# Patient Record
Sex: Male | Born: 2000 | Hispanic: Yes | Marital: Single | Smoking: Never smoker
Health system: Southern US, Community
[De-identification: ages and names within clinical notes are randomized; demographics above are authoritative.]

## PROBLEM LIST (undated history)

## (undated) DIAGNOSIS — H9190 Unspecified hearing loss, unspecified ear: Secondary | ICD-10-CM

## (undated) DIAGNOSIS — IMO0001 Reserved for inherently not codable concepts without codable children: Secondary | ICD-10-CM

## (undated) HISTORY — DX: Reserved for inherently not codable concepts without codable children: IMO0001

## (undated) HISTORY — DX: Unspecified hearing loss, unspecified ear: H91.90

---
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
## 2019-03-26 IMAGING — CR DG ANKLE COMPLETE 3+V*L*
3 series · 3 of 3 positions shown · non-contrast
Comparison: None.

CLINICAL DATA: Left ankle pain after soccer injury.

EXAM:
LEFT ANKLE COMPLETE - 3+ VIEW

[ankle ap]
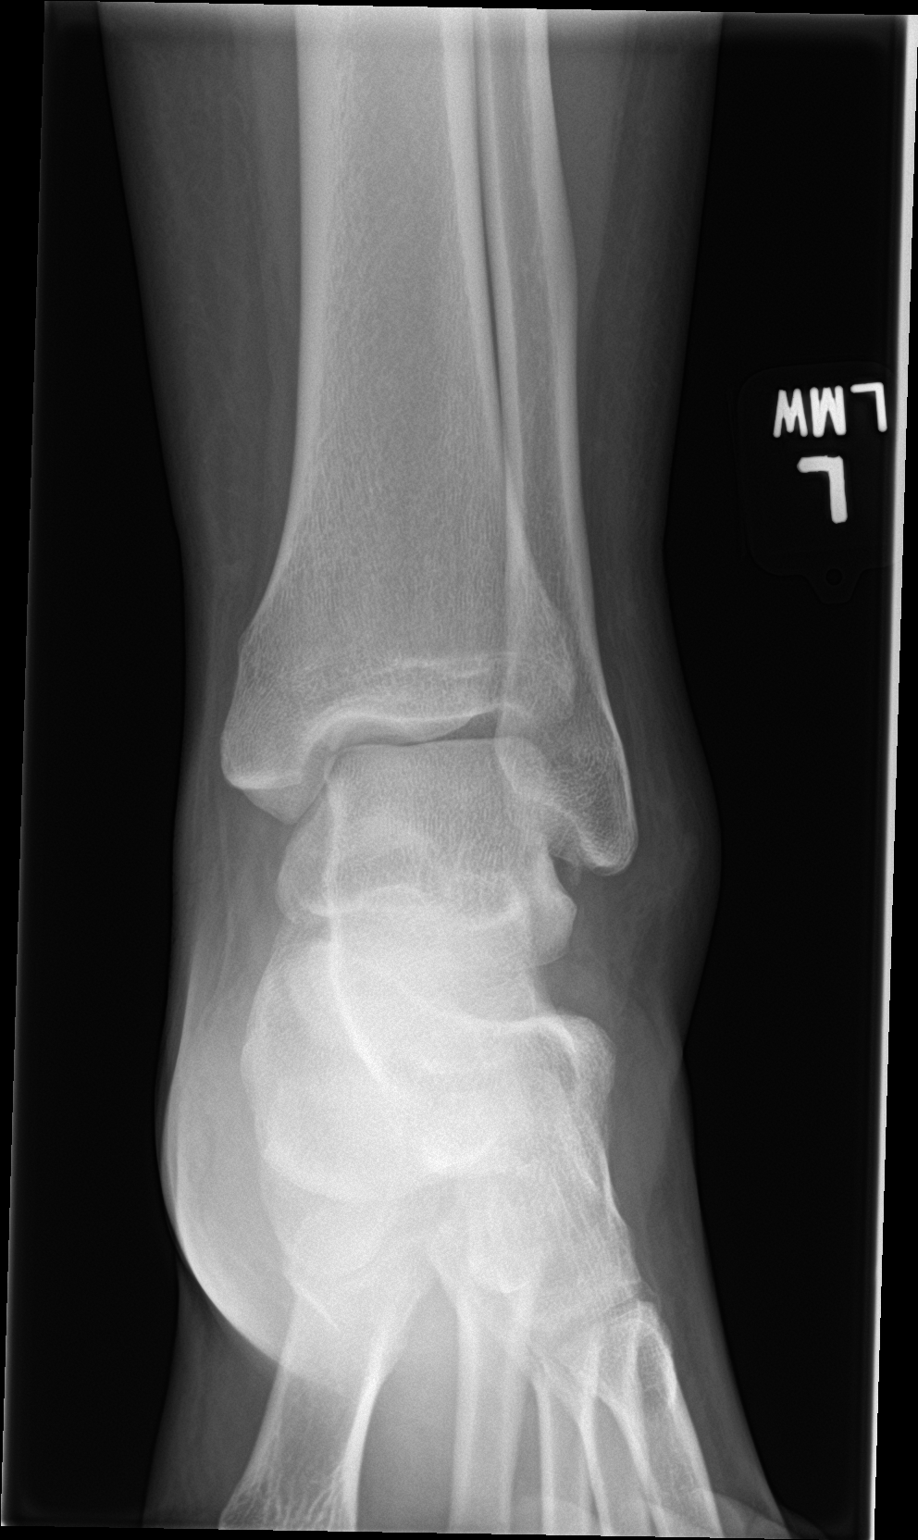

[ankle obl]
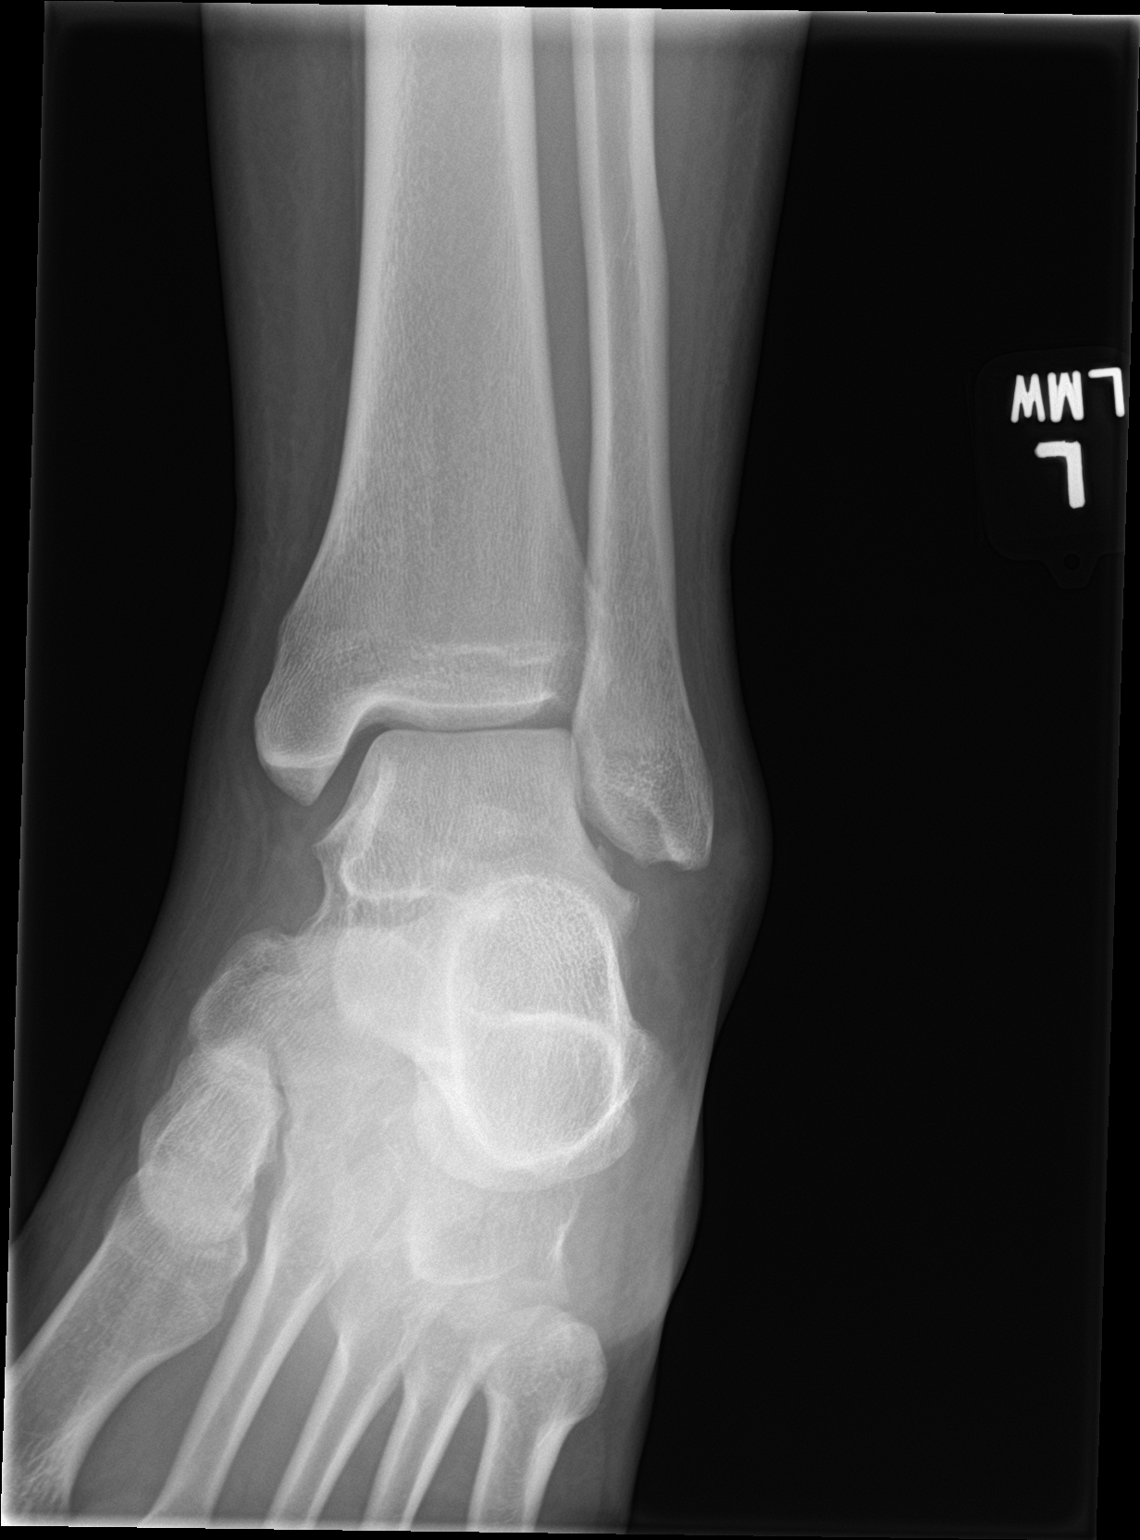

[ankle lat]
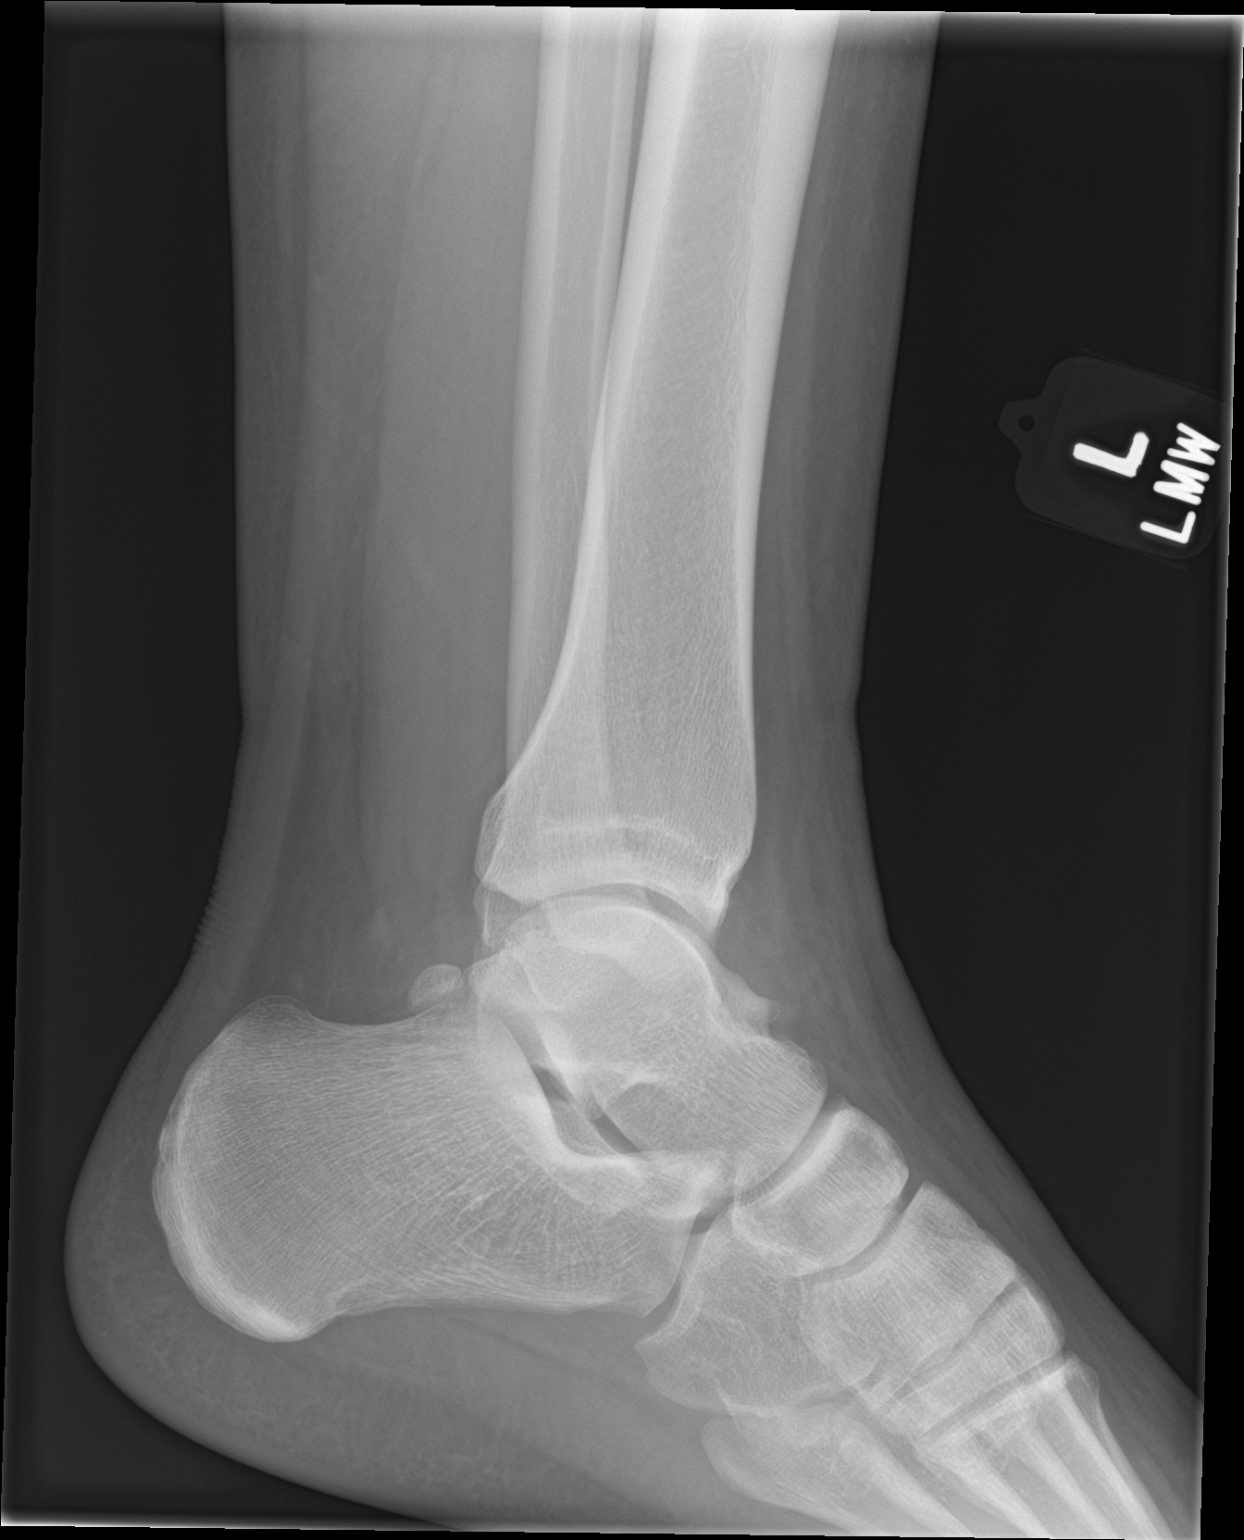

[3 of 3 positions shown; findings below may reference images not displayed]

FINDINGS: There is a small ossific density at the tip of the lateral
malleolus, suspicious for avulsion fracture. Prominent soft tissue
swelling overlying the lateral malleolus. The talar dome is intact.
The ankle mortise is symmetric. Moderate tibiotalar joint effusion.
Bone mineralization is normal.
IMPRESSION: 1. Small avulsion fracture of the lateral malleolus with overlying
soft tissue swelling.

## 2019-05-24 DIAGNOSIS — Z974 Presence of external hearing-aid: Secondary | ICD-10-CM | POA: Diagnosis not present

## 2019-05-24 DIAGNOSIS — H903 Sensorineural hearing loss, bilateral: Secondary | ICD-10-CM | POA: Diagnosis not present

## 2019-06-07 DIAGNOSIS — H905 Unspecified sensorineural hearing loss: Secondary | ICD-10-CM | POA: Diagnosis not present

## 2019-06-10 ENCOUNTER — Other Ambulatory Visit: Payer: Self-pay | Admitting: Family Medicine

## 2019-06-10 ENCOUNTER — Ambulatory Visit: Payer: Medicaid Other | Admitting: Physician Assistant

## 2019-06-10 ENCOUNTER — Other Ambulatory Visit: Payer: Self-pay

## 2019-06-10 DIAGNOSIS — Z113 Encounter for screening for infections with a predominantly sexual mode of transmission: Secondary | ICD-10-CM

## 2019-06-10 DIAGNOSIS — Z0389 Encounter for observation for other suspected diseases and conditions ruled out: Secondary | ICD-10-CM | POA: Diagnosis not present

## 2019-06-10 DIAGNOSIS — Z1388 Encounter for screening for disorder due to exposure to contaminants: Secondary | ICD-10-CM | POA: Diagnosis not present

## 2019-06-10 DIAGNOSIS — Z3009 Encounter for other general counseling and advice on contraception: Secondary | ICD-10-CM | POA: Diagnosis not present

## 2019-06-11 ENCOUNTER — Encounter: Payer: Self-pay | Admitting: Physician Assistant

## 2019-06-11 LAB — GRAM STAIN

## 2019-06-11 NOTE — Progress Notes (Signed)
    STI clinic/screening visit  Subjective:  Jack Christian is a 18 y.o. male being seen today for an STI screening visit. The patient reports they do not have symptoms.   Patient has the following medical conditions:  There are no active problems to display for this patient.    Chief Complaint  Patient presents with  . SEXUALLY TRANSMITTED DISEASE    HPI  Patient reports that he is not having any symptoms but would like a screening today.  Denies chronic conditions and history of surgery but states that he has a hearing impairment and will be evaluated for cochlear implant surgery in the next month.    See flowsheet for further details and programmatic requirements.    The following portions of the patient's history were reviewed and updated as appropriate: allergies, current medications, past medical history, past social history, past surgical history and problem list.  Objective:  There were no vitals filed for this visit.  Physical Exam Constitutional:      General: He is not in acute distress.    Appearance: Normal appearance. He is obese.  HENT:     Head: Normocephalic and atraumatic.     Comments: No nits, lice, or hair loss. No adenopathy in the head and neck area.    Mouth/Throat:     Mouth: Mucous membranes are moist.     Pharynx: Oropharynx is clear. No oropharyngeal exudate or posterior oropharyngeal erythema.  Eyes:     Conjunctiva/sclera: Conjunctivae normal.  Neck:     Musculoskeletal: Neck supple.     Comments: No axillary adenopathy. Pulmonary:     Effort: Pulmonary effort is normal.  Abdominal:     Palpations: Abdomen is soft. There is no mass.     Tenderness: There is no abdominal tenderness. There is no guarding or rebound.  Genitourinary:    Penis: Normal.      Scrotum/Testes: Normal.     Comments: Pubic area without nits, lice, edema, erythema, lesions and inguinal adenopathy. Penis uncircumcised and without discharge at  meatus. Lymphadenopathy:     Cervical: No cervical adenopathy.  Skin:    General: Skin is warm and dry.     Findings: No bruising, erythema, lesion or rash.  Neurological:     Mental Status: He is alert and oriented to person, place, and time.  Psychiatric:        Mood and Affect: Mood normal.        Behavior: Behavior normal.        Thought Content: Thought content normal.        Judgment: Judgment normal.       Assessment and Plan:  Jack Christian is a 18 y.o. male presenting to the [facility] Department for STI screening  1. Screening for STD (sexually transmitted disease) Patient into clinic without symptoms.   Rec condoms with all sex. Await test results.  Counseled that RN will call if needs to RTC for treatment once results are back.   - Gram stain - Gonococcus culture - HIV [facility] - Syphilis Serology, [facility]     No follow-ups on file.  No future appointments.  Jerene Dilling, PA

## 2019-06-15 LAB — GONOCOCCUS CULTURE

## 2019-07-01 NOTE — Addendum Note (Signed)
Addended by: Cletis [employer] on: 07/01/2019 04:35 PM   Modules accepted: Orders

## 2019-07-08 DIAGNOSIS — H905 Unspecified sensorineural hearing loss: Secondary | ICD-10-CM | POA: Diagnosis not present

## 2019-07-08 DIAGNOSIS — H903 Sensorineural hearing loss, bilateral: Secondary | ICD-10-CM | POA: Diagnosis not present

## 2019-07-31 DIAGNOSIS — H905 Unspecified sensorineural hearing loss: Secondary | ICD-10-CM | POA: Diagnosis not present

## 2019-09-02 DIAGNOSIS — Z20822 Contact with and (suspected) exposure to covid-19: Secondary | ICD-10-CM | POA: Diagnosis not present

## 2019-09-02 DIAGNOSIS — Z01812 Encounter for preprocedural laboratory examination: Secondary | ICD-10-CM | POA: Diagnosis not present

## 2019-09-04 DIAGNOSIS — H905 Unspecified sensorineural hearing loss: Secondary | ICD-10-CM | POA: Diagnosis not present

## 2019-09-04 DIAGNOSIS — Z9621 Cochlear implant status: Secondary | ICD-10-CM | POA: Diagnosis not present

## 2019-09-04 DIAGNOSIS — H903 Sensorineural hearing loss, bilateral: Secondary | ICD-10-CM | POA: Diagnosis not present

## 2019-09-26 DIAGNOSIS — H903 Sensorineural hearing loss, bilateral: Secondary | ICD-10-CM | POA: Diagnosis not present

## 2019-09-26 DIAGNOSIS — Z45321 Encounter for adjustment and management of cochlear device: Secondary | ICD-10-CM | POA: Diagnosis not present

## 2020-05-20 DIAGNOSIS — S93402A Sprain of unspecified ligament of left ankle, initial encounter: Secondary | ICD-10-CM | POA: Diagnosis not present

## 2020-06-18 DIAGNOSIS — S93402D Sprain of unspecified ligament of left ankle, subsequent encounter: Secondary | ICD-10-CM | POA: Diagnosis not present

## 2020-07-02 DIAGNOSIS — M25572 Pain in left ankle and joints of left foot: Secondary | ICD-10-CM | POA: Diagnosis not present

## 2023-08-19 DIAGNOSIS — S4991XA Unspecified injury of right shoulder and upper arm, initial encounter: Secondary | ICD-10-CM | POA: Diagnosis not present

## 2023-12-27 DIAGNOSIS — L309 Dermatitis, unspecified: Secondary | ICD-10-CM | POA: Diagnosis not present
